# Patient Record
Sex: Female | Born: 1955 | Hispanic: No | Marital: Single
Health system: Southern US, Community
[De-identification: ages and names within clinical notes are randomized; demographics above are authoritative.]

## PROBLEM LIST (undated history)

## (undated) HISTORY — PX: AUGMENTATION MAMMAPLASTY: SUR837

---

## 1998-09-11 ENCOUNTER — Other Ambulatory Visit: Admission: RE | Admit: 1998-09-11 | Discharge: 1998-09-11 | Payer: Self-pay | Admitting: Family Medicine

## 2001-12-29 ENCOUNTER — Other Ambulatory Visit: Admission: RE | Admit: 2001-12-29 | Discharge: 2001-12-29 | Payer: Self-pay | Admitting: Family Medicine

## 2002-01-06 ENCOUNTER — Encounter: Admission: RE | Admit: 2002-01-06 | Discharge: 2002-01-06 | Payer: Self-pay | Admitting: Family Medicine

## 2002-01-06 ENCOUNTER — Encounter: Payer: Self-pay | Admitting: Family Medicine

## 2006-02-18 ENCOUNTER — Other Ambulatory Visit: Admission: RE | Admit: 2006-02-18 | Discharge: 2006-02-18 | Payer: Self-pay | Admitting: Family Medicine

## 2006-06-14 ENCOUNTER — Encounter: Admission: RE | Admit: 2006-06-14 | Discharge: 2006-06-14 | Payer: Self-pay | Admitting: Orthopaedic Surgery

## 2006-12-06 ENCOUNTER — Emergency Department (HOSPITAL_COMMUNITY): Admission: EM | Admit: 2006-12-06 | Discharge: 2006-12-06 | Payer: Self-pay | Admitting: Emergency Medicine

## 2007-03-03 ENCOUNTER — Other Ambulatory Visit: Admission: RE | Admit: 2007-03-03 | Discharge: 2007-03-03 | Payer: Self-pay | Admitting: Family Medicine

## 2010-12-02 ENCOUNTER — Encounter: Payer: Self-pay | Admitting: Orthopaedic Surgery

## 2013-03-23 ENCOUNTER — Ambulatory Visit: Payer: Self-pay | Admitting: Internal Medicine

## 2015-01-13 ENCOUNTER — Other Ambulatory Visit: Payer: Self-pay | Admitting: Obstetrics & Gynecology

## 2015-01-13 ENCOUNTER — Other Ambulatory Visit (HOSPITAL_COMMUNITY)
Admission: RE | Admit: 2015-01-13 | Discharge: 2015-01-13 | Disposition: A | Discharge status: Home / Self Care | Payer: 59 | Source: Ambulatory Visit | Attending: Obstetrics & Gynecology | Admitting: Obstetrics & Gynecology

## 2015-01-13 DIAGNOSIS — Z1151 Encounter for screening for human papillomavirus (HPV): Secondary | ICD-10-CM | POA: Insufficient documentation

## 2015-01-13 DIAGNOSIS — Z01419 Encounter for gynecological examination (general) (routine) without abnormal findings: Secondary | ICD-10-CM | POA: Insufficient documentation

## 2015-01-16 LAB — CYTOLOGY - PAP

## 2015-03-17 ENCOUNTER — Other Ambulatory Visit: Payer: Self-pay | Admitting: Internal Medicine

## 2015-03-17 DIAGNOSIS — Z1231 Encounter for screening mammogram for malignant neoplasm of breast: Secondary | ICD-10-CM

## 2015-03-24 ENCOUNTER — Ambulatory Visit: Payer: 59

## 2015-03-31 ENCOUNTER — Ambulatory Visit
Admission: RE | Admit: 2015-03-31 | Discharge: 2015-03-31 | Disposition: A | Discharge status: Home / Self Care | Payer: 59 | Source: Ambulatory Visit | Attending: Internal Medicine | Admitting: Internal Medicine

## 2015-03-31 DIAGNOSIS — Z1231 Encounter for screening mammogram for malignant neoplasm of breast: Secondary | ICD-10-CM

## 2015-04-13 ENCOUNTER — Other Ambulatory Visit: Payer: Self-pay | Admitting: Gastroenterology

## 2015-07-12 NOTE — Progress Notes (Signed)
07-12-15 1130 Spoke with pt states she will be cancelling procedure for 07-24-15, to call office.

## 2015-07-21 ENCOUNTER — Other Ambulatory Visit: Payer: Self-pay | Admitting: Gastroenterology

## 2015-07-21 NOTE — Progress Notes (Signed)
07-21-15 1655 Left voice message -instructed pt nothing to eat or drink after midnight night before-may take Prilosec with sip water only(if desires). Report to admitting 1000 AM-have insurance card/picture ID, responsible driver, leave metal, jewelry home and valuables. Follow bowel prep instructions per Dr. Henriette Combs office.Call [phone] for any reasons unable to arrive by 1000 AM 07-24-15.

## 2015-07-24 ENCOUNTER — Ambulatory Visit (HOSPITAL_COMMUNITY): Admission: RE | Admit: 2015-07-24 | Payer: 59 | Source: Ambulatory Visit | Admitting: Gastroenterology

## 2015-07-24 ENCOUNTER — Encounter (HOSPITAL_COMMUNITY): Admission: RE | Payer: Self-pay | Source: Ambulatory Visit

## 2015-07-24 ENCOUNTER — Encounter (HOSPITAL_COMMUNITY): Payer: Self-pay | Admitting: Certified Registered Nurse Anesthetist

## 2015-07-24 SURGERY — COLONOSCOPY WITH PROPOFOL
Anesthesia: Monitor Anesthesia Care

## 2015-07-24 MED ORDER — PROPOFOL 10 MG/ML IV BOLUS
INTRAVENOUS | Status: AC
  Filled 2015-07-24: qty 20

## 2015-07-24 NOTE — Anesthesia Preprocedure Evaluation (Deleted)
Anesthesia Evaluation  Patient identified by MRN, date of birth, ID band Patient awake    Reviewed: Allergy & Precautions, H&P , NPO status , Patient's Chart, lab work & pertinent test results  Airway Mallampati: II  TM Distance: >3 FB Neck ROM: full    Dental no notable dental hx. (+) Dental Advisory Given, Teeth Intact   Pulmonary neg pulmonary ROS,    Pulmonary exam normal breath sounds clear to auscultation       Cardiovascular Exercise Tolerance: Good negative cardio ROS Normal cardiovascular exam Rhythm:regular Rate:Normal     Neuro/Psych negative neurological ROS  negative psych ROS   GI/Hepatic negative GI ROS, Neg liver ROS,   Endo/Other  negative endocrine ROS  Renal/GU negative Renal ROS  negative genitourinary   Musculoskeletal   Abdominal   Peds  Hematology negative hematology ROS (+)   Anesthesia Other Findings   Reproductive/Obstetrics negative OB ROS                             Anesthesia Physical Anesthesia Plan  ASA: I  Anesthesia Plan: MAC   Post-op Pain Management:    Induction:   Airway Management Planned:   Additional Equipment:   Intra-op Plan:   Post-operative Plan:   Informed Consent: I have reviewed the patients History and Physical, chart, labs and discussed the procedure including the risks, benefits and alternatives for the proposed anesthesia with the patient or authorized representative who has indicated his/her understanding and acceptance.   Dental Advisory Given  Plan Discussed with: CRNA and Surgeon  Anesthesia Plan Comments:         Anesthesia Quick Evaluation  

## 2015-07-24 NOTE — Progress Notes (Signed)
07-24-15 1610 Spoke with pt after pt left message stating again"I have cancelled this procedure for 07-24-15 and have called Dr. Henriette Combs  Office", this communicated to "Jill,Endo scheduler" and note per Epic and phone call to "Catrina" Dr. Henriette Combs office this AM.

## 2019-12-22 ENCOUNTER — Other Ambulatory Visit: Payer: Self-pay | Admitting: Internal Medicine

## 2019-12-22 DIAGNOSIS — Z1231 Encounter for screening mammogram for malignant neoplasm of breast: Secondary | ICD-10-CM

## 2020-01-28 ENCOUNTER — Ambulatory Visit: Payer: Self-pay

## 2020-04-03 ENCOUNTER — Ambulatory Visit: Payer: Self-pay

## 2020-04-03 ENCOUNTER — Other Ambulatory Visit: Payer: Self-pay | Admitting: Internal Medicine

## 2020-04-03 ENCOUNTER — Other Ambulatory Visit: Payer: Self-pay

## 2020-04-03 ENCOUNTER — Ambulatory Visit
Admission: RE | Admit: 2020-04-03 | Discharge: 2020-04-03 | Disposition: A | Discharge status: Home / Self Care | Payer: BC Managed Care – PPO | Source: Ambulatory Visit | Attending: Internal Medicine | Admitting: Internal Medicine

## 2020-04-03 DIAGNOSIS — Z1231 Encounter for screening mammogram for malignant neoplasm of breast: Secondary | ICD-10-CM

## 2020-07-19 ENCOUNTER — Ambulatory Visit: Payer: BC Managed Care – PPO | Admitting: Dermatology

## 2020-12-26 ENCOUNTER — Telehealth: Payer: Self-pay | Admitting: Hematology and Oncology

## 2020-12-26 NOTE — Telephone Encounter (Signed)
Received a new hem referral from Dr. Donette Larry for mild anemia. Ms. Taylor Dominguez has been cld and scheduled to see Dr. Pamelia Hoit on 3/18 at 830am. Pt awre to arrive 20 minutes early.

## 2021-01-25 NOTE — Progress Notes (Signed)
[facility] CONSULT NOTE  Consulting physician Dr. Eula Listen  CHIEF COMPLAINTS/PURPOSE OF CONSULTATION:  Newly diagnosed anemia  HISTORY OF PRESENTING ILLNESS:  Taylor Dominguez 65 y.o. female is here because of recent diagnosis of anemia. Labs on 12/12/20 showed Hg 11.7, HCT 34.7, platelets 252. She presents to the clinic today for initial evaluation.  She has been suffering from longstanding issues are related to irritable bowel syndrome.  She alternates between constipation and diarrhea.  When she gets diarrhea she feels much weaker and fatigued as well.  Her annual blood work in February showed that she had mild anemia with hemoglobin of 11.7.  This was also noted about a year ago.  Because of concern for anemia she was started on oral iron therapy.  She has appointment to see gastroenterology for upper endoscopy and colonoscopy.  I reviewed her records extensively and collaborated the history with the patient.  MEDICAL HISTORY:  Irritable bowel syndrome SURGICAL HISTORY: Past Surgical History:  Procedure Laterality Date  . AUGMENTATION MAMMAPLASTY Bilateral    replaced 2009     SOCIAL HISTORY: Social History   Socioeconomic History  . Marital status: Single    Spouse name: Not on file  . Number of children: Not on file  . Years of education: Not on file  . Highest education level: Not on file  Occupational History  . Not on file  Tobacco Use  . Smoking status: Not on file  . Smokeless tobacco: Not on file  Substance and Sexual Activity  . Alcohol use: Not on file  . Drug use: Not on file  . Sexual activity: Not on file  Other Topics Concern  . Not on file  Social History Narrative  . Not on file   Social Determinants of Health   Financial Resource Strain: Not on file  Food Insecurity: Not on file  Transportation Needs: Not on file  Physical Activity: Not on file  Stress: Not on file  Social Connections: Not on file  Intimate Partner Violence:  Not on file    FAMILY HISTORY: Family History  Problem Relation Age of Onset  . Breast cancer Neg Hx     ALLERGIES:  has no allergies on file.  MEDICATIONS:  Current Outpatient Medications  Medication Sig Dispense Refill  . omeprazole (PRILOSEC) 20 MG capsule Take by mouth.     No current facility-administered medications for this visit.    REVIEW OF SYSTEMS:   Constitutional: Intermittent fatigue especially when she gets diarrhea. Eyes: Denies blurriness of vision, double vision or watery eyes Ears, nose, mouth, throat, and face: Denies mucositis or sore throat Respiratory: Denies cough, dyspnea or wheezes Cardiovascular: Denies palpitation, chest discomfort or lower extremity swelling Gastrointestinal: Alternating constipation and diarrhea Skin: Denies abnormal skin rashes Lymphatics: Denies new lymphadenopathy or easy bruising Neurological:Denies numbness, tingling or new weaknesses Behavioral/Psych: Mood is stable, no new changes    All other systems were reviewed with the patient and are negative.  PHYSICAL EXAMINATION: ECOG PERFORMANCE STATUS: 1 - Symptomatic but completely ambulatory  There were no vitals filed for this visit. There were no vitals filed for this visit.  GENERAL:alert, no distress and comfortable SKIN: skin color, texture, turgor are normal, no rashes or significant lesions EYES: normal, conjunctiva are pink and non-injected, sclera clear LUNGS: clear to auscultation and percussion with normal breathing effort HEART: regular rate & rhythm and no murmurs and no lower extremity edema ABDOMEN:abdomen soft, non-tender and normal bowel sounds Musculoskeletal:no cyanosis of digits  and no clubbing  PSYCH: alert & oriented x 3 with fluent speech NEURO: no focal motor/sensory deficits  RADIOGRAPHIC STUDIES: I have personally reviewed the radiological reports and agreed with the findings in the report.  ASSESSMENT AND PLAN:  Normocytic anemia Lab  review 12/25/2020: WBC 3.7, hemoglobin 11.7, MCV 94.9, RDW 12.8, platelets 252 (we do not have any prior labs to compare) Mild anemia: Normocytic in nature I discussed with the patient about the different causes of anemia.  Plan: We will check blood work for CBC with differential, iron studies with ferritin, B12 and folic acid. Previous blood work did not show any abnormalities of liver function or kidney function.   Currently patient is taking oral iron therapy.  I asked her to stop it and then we can recheck her labs on Monday. We will check CBC with differential, iron studies, B12 and folic acid. If the iron levels are adequate then I will discontinue oral iron.  Fatigue: Most likely related to irritable bowel syndrome and the diarrhea spells.  Patient is going to see gastroenterology. I will call her with the results of these tests.    All questions were answered. The patient knows to call the clinic with any problems, questions or concerns.   Sabas Sous, MD, MPH 01/26/2021    I, Molly Dorshimer, am acting as scribe for Serena Croissant, MD.  I have reviewed the above documentation for accuracy and completeness, and I agree with the above.

## 2021-01-26 ENCOUNTER — Other Ambulatory Visit: Payer: Self-pay

## 2021-01-26 ENCOUNTER — Inpatient Hospital Stay: Payer: BC Managed Care – PPO | Attending: Hematology and Oncology | Admitting: Hematology and Oncology

## 2021-01-26 DIAGNOSIS — K580 Irritable bowel syndrome with diarrhea: Secondary | ICD-10-CM

## 2021-01-26 DIAGNOSIS — D649 Anemia, unspecified: Secondary | ICD-10-CM | POA: Diagnosis present

## 2021-01-26 DIAGNOSIS — R5383 Other fatigue: Secondary | ICD-10-CM | POA: Diagnosis not present

## 2021-01-26 NOTE — Assessment & Plan Note (Signed)
Lab review 12/25/2020: WBC 3.7, hemoglobin 11.7, MCV 94.9, RDW 12.8, platelets 252 (we do not have any prior labs to compare) Mild anemia: Normocytic in nature I discussed with the patient about the different causes of anemia.  Plan: We will check blood work for CBC with differential, iron studies with ferritin, B12 and folic acid. Previous blood work did not show any abnormalities of liver function or kidney function. There is very little evidence to suggest work-up for myeloma at this time.  I will call her with the results of these tests.

## 2021-07-05 IMAGING — MG DIGITAL SCREENING BREAST BILAT IMPLANT W/ TOMO W/ CAD
9 of 12 series · 9 of 28 positions shown · non-contrast
Comparison: Previous exam(s).

CLINICAL DATA: Screening.

EXAM:
DIGITAL SCREENING BILATERAL MAMMOGRAM WITH IMPLANTS, CAD AND TOMO
The patient has retroglandular implants. Standard and implant
displaced views were performed.

[R MLO]
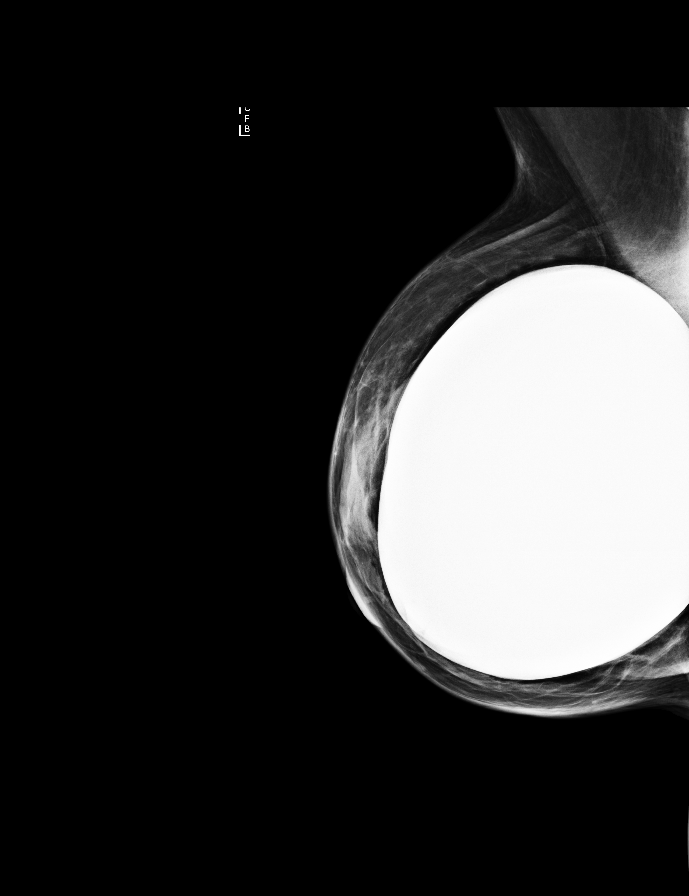

[R CC]
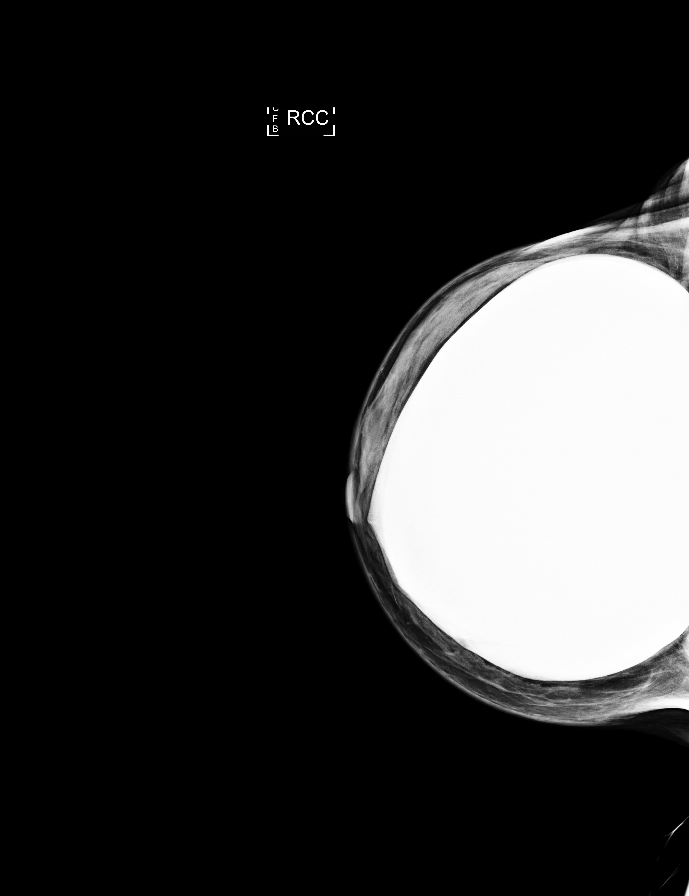

[L MLO]
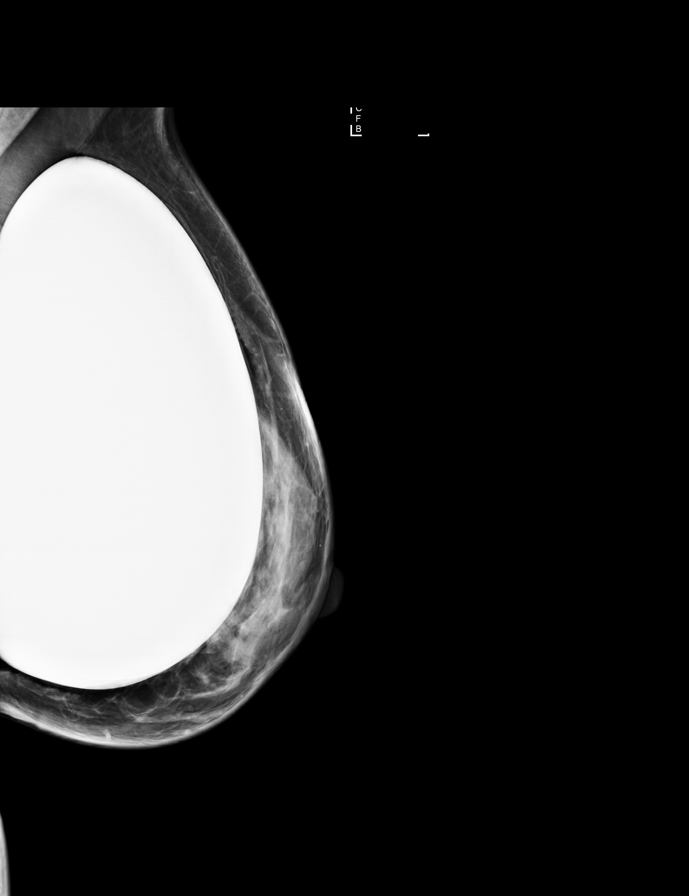

[L CC]
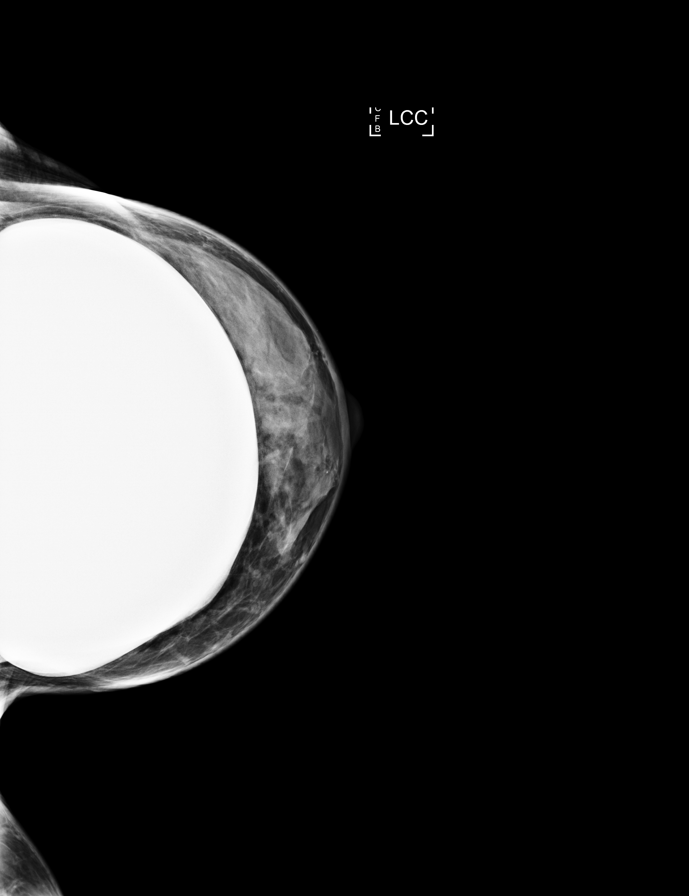

[L MLO synth-2D]
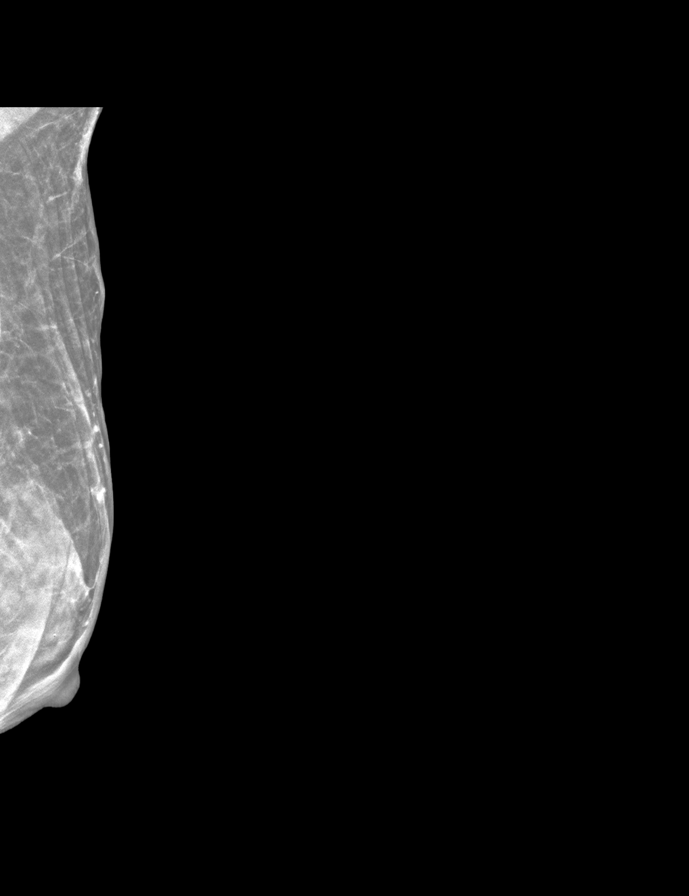

[R MLO synth-2D]
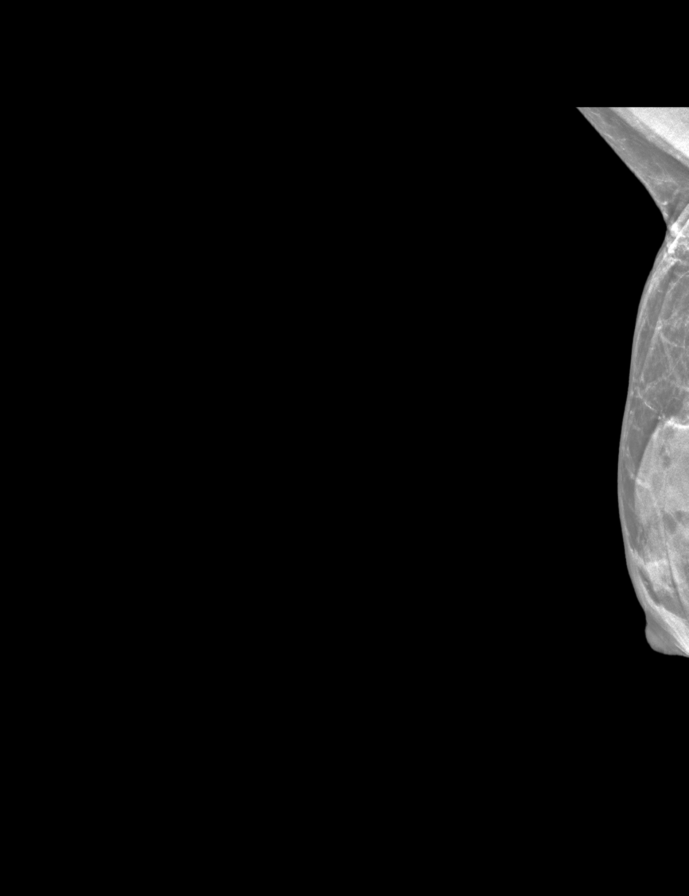

[R CC synth-2D]
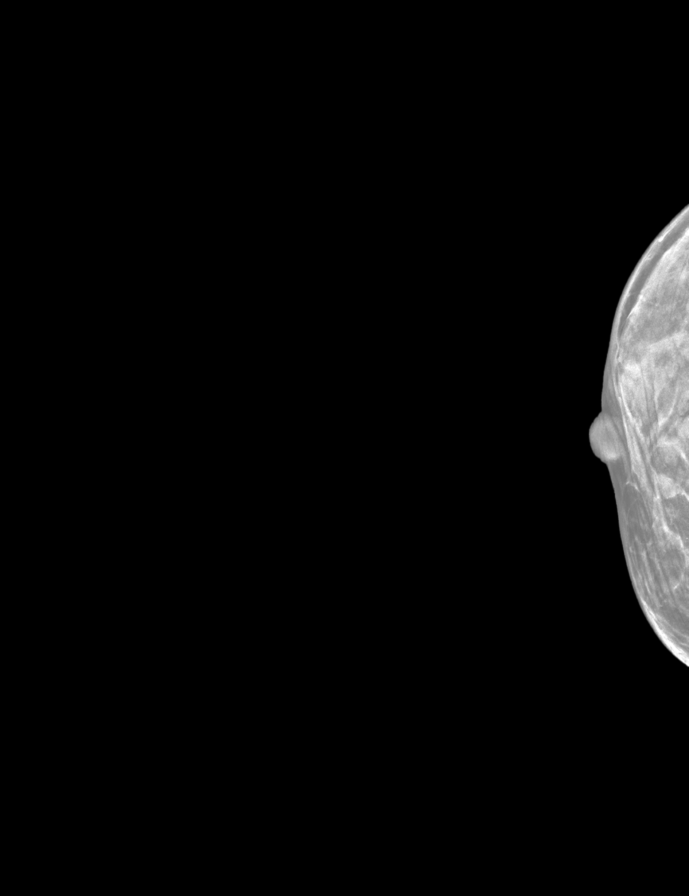

[L CC synth-2D]
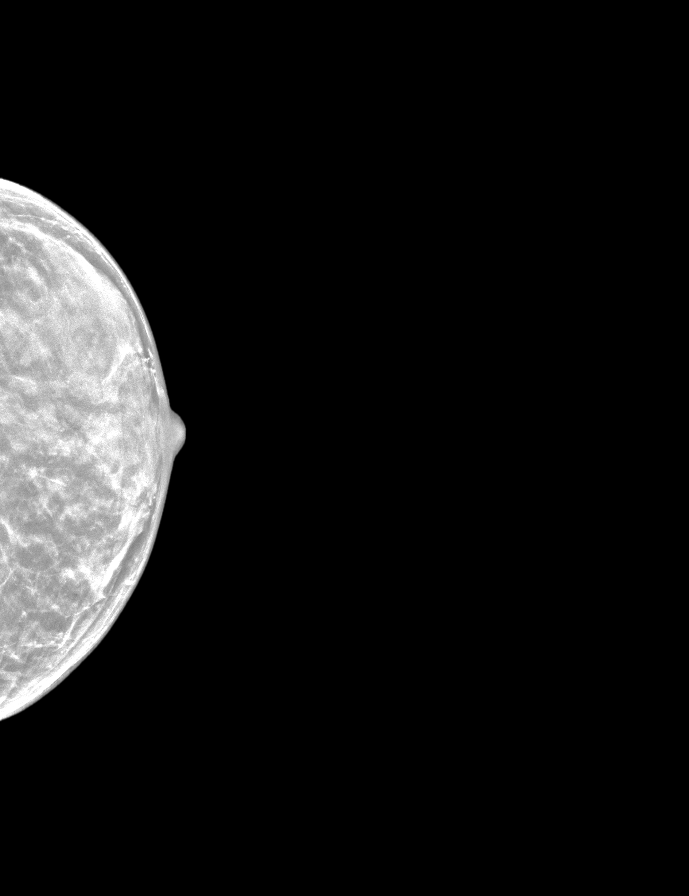

[L CCID BREAST TOMOSYNTHESIS IMAGE tomo · tomo slice 19/36.0]
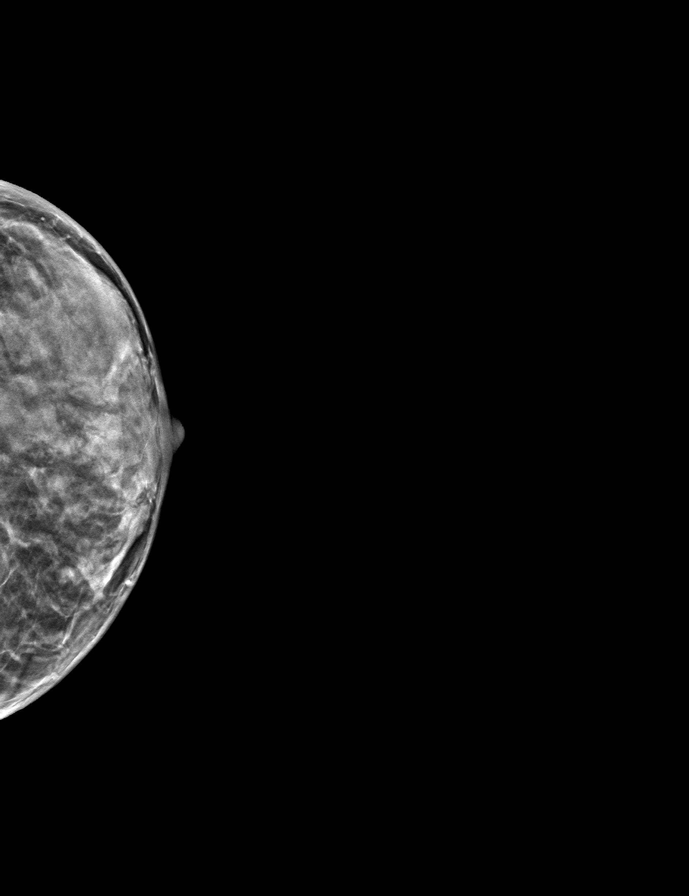

[9 of 28 positions shown; findings below may reference images not displayed]

ACR Breast Density Category d: The breast tissue is extremely dense,
which lowers the sensitivity of mammography.
FINDINGS: There are no findings suspicious for malignancy. Images were
processed with CAD.
IMPRESSION: No mammographic evidence of malignancy. A result letter of this
screening mammogram will be mailed directly to the patient.

RECOMMENDATION:
Screening mammogram in one year. (Code:I3-Q-JNP)

BI-RADS CATEGORY  1:  Negative.

## 2022-04-09 ENCOUNTER — Ambulatory Visit (INDEPENDENT_AMBULATORY_CARE_PROVIDER_SITE_OTHER): Payer: 59

## 2022-04-09 ENCOUNTER — Ambulatory Visit
Admission: EM | Admit: 2022-04-09 | Discharge: 2022-04-09 | Disposition: A | Discharge status: Home / Self Care | Payer: 59 | Attending: Internal Medicine | Admitting: Internal Medicine

## 2022-04-09 ENCOUNTER — Other Ambulatory Visit: Payer: Self-pay

## 2022-04-09 ENCOUNTER — Encounter: Payer: Self-pay | Admitting: Emergency Medicine

## 2022-04-09 DIAGNOSIS — R059 Cough, unspecified: Secondary | ICD-10-CM | POA: Diagnosis not present

## 2022-04-09 DIAGNOSIS — R509 Fever, unspecified: Secondary | ICD-10-CM | POA: Diagnosis not present

## 2022-04-09 DIAGNOSIS — J180 Bronchopneumonia, unspecified organism: Secondary | ICD-10-CM

## 2022-04-09 MED ORDER — AZITHROMYCIN 250 MG PO TABS: ORAL_TABLET | ORAL | 0 refills | Status: AC

## 2022-04-09 MED ORDER — AMOXICILLIN 500 MG PO CAPS: 1000.0000 mg | ORAL_CAPSULE | Freq: Three times a day (TID) | ORAL | 0 refills | Status: AC

## 2022-04-09 MED ORDER — ALBUTEROL SULFATE HFA 108 (90 BASE) MCG/ACT IN AERS: 1.0000 | INHALATION_SPRAY | Freq: Four times a day (QID) | RESPIRATORY_TRACT | 0 refills | Status: DC | PRN

## 2022-04-09 MED ORDER — ACETAMINOPHEN 325 MG PO TABS
650.0000 mg | ORAL_TABLET | Freq: Once | ORAL | Status: AC
  Administered 2022-04-09: 650 mg via ORAL

## 2022-04-09 MED ORDER — PREDNISONE 10 MG (21) PO TBPK: ORAL_TABLET | Freq: Every day | ORAL | 0 refills | Status: DC

## 2022-04-09 NOTE — ED Provider Notes (Signed)
[name]    CSN: [id number] Arrival date & time: 04/09/22  1722      History   Chief Complaint Chief Complaint  Patient presents with   Cough   Fever    HPI Taylor Dominguez is a 66 y.o. female.   Patient presents with cough, fever, nasal congestion.  Patient reports the nasal congestion has been present for approximately 3 weeks.  Cough and fever developed approximately 1.5 weeks ago.  Patient has had low-grade temp at home.  Her daughter has had similar symptoms recently.  Denies chest pain, sore throat, ear pain, nausea, vomiting, diarrhea, abdominal pain.  Patient does report intermittent shortness of breath with exertion.  Denies history of COPD or asthma.  Patient has taken over-the-counter cold and flu medication with minimal improvement in symptoms.   Cough Fever  History reviewed. No pertinent past medical history.  Patient Active Problem List   Diagnosis Date Noted   Normocytic anemia 01/26/2021    Past Surgical History:  Procedure Laterality Date   AUGMENTATION MAMMAPLASTY Bilateral    replaced 2009     OB History   No obstetric history on file.      Home Medications    Prior to Admission medications   Medication Sig Start Date End Date Taking? Authorizing Provider  albuterol (VENTOLIN HFA) 108 (90 Base) MCG/ACT inhaler Inhale 1-2 puffs into the lungs every 6 (six) hours as needed for wheezing or shortness of breath. 04/09/22  Yes Bailee Metter, Rolly SalterHaley E, FNP  amoxicillin (AMOXIL) 500 MG capsule Take 2 capsules (1,000 mg total) by mouth 3 (three) times daily for 5 days. 04/09/22 04/14/22 Yes Mael Delap, Acie FredricksonHaley E, FNP  azithromycin (ZITHROMAX Z-PAK) 250 MG tablet Take 2 tablets (500 mg total) by mouth daily for 1 day, THEN 1 tablet (250 mg total) daily for 4 days. 04/09/22 04/14/22 Yes Pius Byrom, Acie FredricksonHaley E, FNP  predniSONE (STERAPRED UNI-PAK 21 TAB) 10 MG (21) TBPK tablet Take by mouth daily. Take 6 tabs by mouth daily  for 2 days, then 5 tabs for 2 days, then 4  tabs for 2 days, then 3 tabs for 2 days, 2 tabs for 2 days, then 1 tab by mouth daily for 2 days 04/09/22  Yes Yeny Schmoll, BeaconHaley E, FNP  omeprazole (PRILOSEC) 20 MG capsule Take by mouth.    [provider]    Family History Family History  Problem Relation Age of Onset   Breast cancer Neg Hx     Social History     Allergies   Patient has no known allergies.   Review of Systems Review of Systems Per HPI  Physical Exam Triage Vital Signs ED Triage Vitals [04/09/22 1910]  Enc Vitals Group     BP 131/84     Pulse Rate (!) 103     Resp 18     Temp (!) 101.5 F (38.6 C)     Temp Source Oral     SpO2 98 %     Weight      Height      Head Circumference      Peak Flow      Pain Score 5     Pain Loc      Pain Edu?      Excl. in GC?    No data found.  Updated Vital Signs BP 131/84 (BP Location: Left Arm)   Pulse (!) 103   Temp (!) 101.5 F (38.6 C) (Oral)   Resp 18   SpO2  98%   Visual Acuity Right Eye Distance:   Left Eye Distance:   Bilateral Distance:    Right Eye Near:   Left Eye Near:    Bilateral Near:     Physical Exam Constitutional:      General: She is not in acute distress.    Appearance: Normal appearance. She is not toxic-appearing or diaphoretic.  HENT:     Head: Normocephalic and atraumatic.     Right Ear: Tympanic membrane and ear canal normal.     Left Ear: Tympanic membrane and ear canal normal.     Nose: Congestion present.     Mouth/Throat:     Mouth: Mucous membranes are moist.     Pharynx: No posterior oropharyngeal erythema.  Eyes:     Extraocular Movements: Extraocular movements intact.     Conjunctiva/sclera: Conjunctivae normal.     Pupils: Pupils are equal, round, and reactive to light.  Cardiovascular:     Rate and Rhythm: Normal rate and regular rhythm.     Pulses: Normal pulses.     Heart sounds: Normal heart sounds.  Pulmonary:     Effort: Pulmonary effort is normal. No respiratory distress.     Breath sounds:  No stridor. Wheezing and rhonchi present. No rales.     Comments: Mild wheezing noted on exam.  Abdominal:     General: Abdomen is flat. Bowel sounds are normal.     Palpations: Abdomen is soft.  Musculoskeletal:        General: Normal range of motion.     Cervical back: Normal range of motion.  Skin:    General: Skin is warm and dry.  Neurological:     General: No focal deficit present.     Mental Status: She is alert and oriented to person, place, and time. Mental status is at baseline.  Psychiatric:        Mood and Affect: Mood normal.        Behavior: Behavior normal.     UC Treatments / Results  Labs (all labs ordered are listed, but only abnormal results are displayed) Labs Reviewed - No data to display  EKG   Radiology DG Chest 2 View  Result Date: 04/09/2022 CLINICAL DATA:  Cough and fever for 2 weeks EXAM: CHEST - 2 VIEW COMPARISON:  None Available. FINDINGS: Frontal and lateral views of the chest are obtained. Evaluation is limited by suboptimal technique. Cardiac silhouette is unremarkable. The lungs are hyperinflated, with patchy left lower lobe airspace disease identified. No effusion or pneumothorax. No acute bony abnormalities. IMPRESSION: 1. Patchy left lower lobe airspace disease consistent with bronchopneumonia. Electronically Signed   By: Sharlet Salina M.D.   On: 04/09/2022 19:31    Procedures Procedures (including critical care time)  Medications Ordered in UC Medications  acetaminophen (TYLENOL) tablet 650 mg (650 mg Oral Given 04/09/22 1914)    Initial Impression / Assessment and Plan / UC Course  I have reviewed the triage vital signs and the nursing notes.  Pertinent labs & imaging results that were available during my care of the patient were reviewed by me and considered in my medical decision making (see chart for details).     Chest x-ray showing bronchopneumonia.  Will treat with amoxicillin and azithromycin.  Prednisone steroid taper also  prescribed to decrease inflammation and help with wheezing.  Patient also prescribed albuterol inhaler.  Patient encouraged to follow-up in 4 to 6 weeks to have repeat chest x-ray to ensure adequate resolution  of pneumonia.  No respiratory distress so do think outpatient treatment is okay.  She was given strict return and ER precautions.  Fever monitoring and management discussed with patient.  Tylenol administered in urgent care.  Patient verbalized understanding and was agreeable with plan. Final Clinical Impressions(s) / UC Diagnoses   Final diagnoses:  Bronchopneumonia     Discharge Instructions      You have pneumonia which is being treated with 4 different medications.  Please follow-up in 4 to 6 weeks to have repeat chest x-ray completed to ensure that it has resolved.  Follow-up sooner if symptoms persist or worsen.    ED Prescriptions     Medication Sig Dispense Auth. Provider   amoxicillin (AMOXIL) 500 MG capsule Take 2 capsules (1,000 mg total) by mouth 3 (three) times daily for 5 days. 30 capsule [place], [place], [place]   azithromycin (ZITHROMAX Z-PAK) 250 MG tablet Take 2 tablets (500 mg total) by mouth daily for 1 day, THEN 1 tablet (250 mg total) daily for 4 days. 6 tablet [place], [place] E, [place]   predniSONE (STERAPRED UNI-PAK 21 TAB) 10 MG (21) TBPK tablet Take by mouth daily. Take 6 tabs by mouth daily  for 2 days, then 5 tabs for 2 days, then 4 tabs for 2 days, then 3 tabs for 2 days, 2 tabs for 2 days, then 1 tab by mouth daily for 2 days 42 tablet Tamre Cass, Rolly Salter E, FNP   albuterol (VENTOLIN HFA) 108 (90 Base) MCG/ACT inhaler Inhale 1-2 puffs into the lungs every 6 (six) hours as needed for wheezing or shortness of breath. 1 each Gustavus Bryant, [place]      PDMP not reviewed this encounter.   Gustavus Bryant, [place] 04/09/22 2015

## 2022-04-09 NOTE — ED Triage Notes (Signed)
Pt here for cough and fever x 2 weeks

## 2022-04-09 NOTE — Discharge Instructions (Signed)
You have pneumonia which is being treated with 4 different medications.  Please follow-up in 4 to 6 weeks to have repeat chest x-ray completed to ensure that it has resolved.  Follow-up sooner if symptoms persist or worsen.

## 2022-08-12 DIAGNOSIS — G548 Other nerve root and plexus disorders: Secondary | ICD-10-CM | POA: Diagnosis not present

## 2022-08-12 DIAGNOSIS — L603 Nail dystrophy: Secondary | ICD-10-CM | POA: Diagnosis not present

## 2022-08-12 DIAGNOSIS — D485 Neoplasm of uncertain behavior of skin: Secondary | ICD-10-CM | POA: Diagnosis not present

## 2022-08-12 DIAGNOSIS — L578 Other skin changes due to chronic exposure to nonionizing radiation: Secondary | ICD-10-CM | POA: Diagnosis not present

## 2022-08-12 DIAGNOSIS — D045 Carcinoma in situ of skin of trunk: Secondary | ICD-10-CM | POA: Diagnosis not present

## 2022-08-12 DIAGNOSIS — L989 Disorder of the skin and subcutaneous tissue, unspecified: Secondary | ICD-10-CM | POA: Diagnosis not present

## 2022-08-26 DIAGNOSIS — D0372 Melanoma in situ of left lower limb, including hip: Secondary | ICD-10-CM | POA: Diagnosis not present

## 2022-08-26 DIAGNOSIS — D487 Neoplasm of uncertain behavior of other specified sites: Secondary | ICD-10-CM | POA: Diagnosis not present

## 2022-09-02 ENCOUNTER — Other Ambulatory Visit: Payer: Self-pay | Admitting: Internal Medicine

## 2022-09-02 ENCOUNTER — Ambulatory Visit
Admission: RE | Admit: 2022-09-02 | Discharge: 2022-09-02 | Disposition: A | Discharge status: Home / Self Care | Payer: Medicare Other | Source: Ambulatory Visit | Attending: Internal Medicine | Admitting: Internal Medicine

## 2022-09-02 DIAGNOSIS — E2839 Other primary ovarian failure: Secondary | ICD-10-CM

## 2022-09-02 DIAGNOSIS — R0789 Other chest pain: Secondary | ICD-10-CM | POA: Diagnosis not present

## 2022-09-02 DIAGNOSIS — K589 Irritable bowel syndrome without diarrhea: Secondary | ICD-10-CM | POA: Diagnosis not present

## 2022-09-02 DIAGNOSIS — Z1231 Encounter for screening mammogram for malignant neoplasm of breast: Secondary | ICD-10-CM

## 2022-09-02 DIAGNOSIS — D649 Anemia, unspecified: Secondary | ICD-10-CM | POA: Diagnosis not present

## 2022-09-02 DIAGNOSIS — K219 Gastro-esophageal reflux disease without esophagitis: Secondary | ICD-10-CM | POA: Diagnosis not present

## 2022-09-02 DIAGNOSIS — Z0000 Encounter for general adult medical examination without abnormal findings: Secondary | ICD-10-CM | POA: Diagnosis not present

## 2022-09-02 DIAGNOSIS — Z136 Encounter for screening for cardiovascular disorders: Secondary | ICD-10-CM | POA: Diagnosis not present

## 2022-09-09 DIAGNOSIS — C44529 Squamous cell carcinoma of skin of other part of trunk: Secondary | ICD-10-CM | POA: Diagnosis not present

## 2022-09-09 DIAGNOSIS — D045 Carcinoma in situ of skin of trunk: Secondary | ICD-10-CM | POA: Diagnosis not present

## 2022-09-18 DIAGNOSIS — H353131 Nonexudative age-related macular degeneration, bilateral, early dry stage: Secondary | ICD-10-CM | POA: Diagnosis not present

## 2022-09-18 DIAGNOSIS — H52203 Unspecified astigmatism, bilateral: Secondary | ICD-10-CM | POA: Diagnosis not present

## 2022-09-18 DIAGNOSIS — H5213 Myopia, bilateral: Secondary | ICD-10-CM | POA: Diagnosis not present

## 2022-10-06 ENCOUNTER — Ambulatory Visit
Admission: EM | Admit: 2022-10-06 | Discharge: 2022-10-06 | Disposition: A | Discharge status: Home / Self Care | Payer: Medicare Other | Attending: Physician Assistant | Admitting: Physician Assistant

## 2022-10-06 ENCOUNTER — Other Ambulatory Visit: Payer: Self-pay

## 2022-10-06 ENCOUNTER — Encounter: Payer: Self-pay | Admitting: Emergency Medicine

## 2022-10-06 DIAGNOSIS — N751 Abscess of Bartholin's gland: Secondary | ICD-10-CM

## 2022-10-06 MED ORDER — DOXYCYCLINE HYCLATE 100 MG PO CAPS: 100.0000 mg | ORAL_CAPSULE | Freq: Two times a day (BID) | ORAL | 0 refills | Status: DC

## 2022-10-06 NOTE — ED Triage Notes (Signed)
Pt here for left sided vaginal abscess with pain and swelling x 5 days

## 2022-10-06 NOTE — ED Provider Notes (Signed)
[name]    CSN: [id number] Arrival date & time: 10/06/22  0940      History   Chief Complaint Chief Complaint  Patient presents with   Abscess    HPI Taylor Dominguez is a 66 y.o. female.   Patient here today for evaluate of vulvar abscess that started 5 days ago.  She reports that prior to that she had a small knot in the area but has never had swelling to this effect.  She states that the area is very tender.  She has tried warm compresses without resolution.  She does not report any drainage.  She denies any fever.  She has not any nausea or vomiting.  The history is provided by the patient.    History reviewed. No pertinent past medical history.  Patient Active Problem List   Diagnosis Date Noted   Normocytic anemia 01/26/2021    Past Surgical History:  Procedure Laterality Date   AUGMENTATION MAMMAPLASTY Bilateral    replaced 2009     OB History   No obstetric history on file.      Home Medications    Prior to Admission medications   Medication Sig Start Date End Date Taking? Authorizing Provider  doxycycline (VIBRAMYCIN) 100 MG capsule Take 1 capsule (100 mg total) by mouth 2 (two) times daily. 10/06/22  Yes Tomi Bamberger, PA-C  albuterol (VENTOLIN HFA) 108 (90 Base) MCG/ACT inhaler Inhale 1-2 puffs into the lungs every 6 (six) hours as needed for wheezing or shortness of breath. 04/09/22   Gustavus Bryant, FNP  omeprazole (PRILOSEC) 20 MG capsule Take by mouth.    [provider]  predniSONE (STERAPRED UNI-PAK 21 TAB) 10 MG (21) TBPK tablet Take by mouth daily. Take 6 tabs by mouth daily  for 2 days, then 5 tabs for 2 days, then 4 tabs for 2 days, then 3 tabs for 2 days, 2 tabs for 2 days, then 1 tab by mouth daily for 2 days Patient not taking: Reported on 10/06/2022 04/09/22   Gustavus Bryant, FNP    Family History Family History  Problem Relation Age of Onset   Breast cancer Neg Hx     Social History      Allergies   Patient has no known allergies.   Review of Systems Review of Systems  Constitutional:  Negative for chills and fever.  Eyes:  Negative for discharge and redness.  Respiratory:  Negative for shortness of breath.   Gastrointestinal:  Negative for nausea and vomiting.  Skin:  Positive for color change. Negative for wound.     Physical Exam Triage Vital Signs ED Triage Vitals [10/06/22 1210]  Enc Vitals Group     BP 132/78     Pulse Rate 65     Resp 13     Temp (!) 97.4 F (36.3 C)     Temp Source Oral     SpO2 98 %     Weight      Height      Head Circumference      Peak Flow      Pain Score      Pain Loc      Pain Edu?      Excl. in GC?    No data found.  Updated Vital Signs BP 132/78 (BP Location: Left Arm)   Pulse 65   Temp (!) 97.4 F (36.3 C) (Oral)   Resp 13   SpO2 98%  Physical Exam Vitals and nursing note reviewed.  Constitutional:      General: She is not in acute distress.    Appearance: Normal appearance. She is not ill-appearing.  HENT:     Head: Normocephalic and atraumatic.  Eyes:     Conjunctiva/sclera: Conjunctivae normal.  Cardiovascular:     Rate and Rhythm: Normal rate.  Pulmonary:     Effort: Pulmonary effort is normal. No respiratory distress.  Genitourinary:    Comments: Diffuse swelling and erythema to left vulva, no active drainage Neurological:     Mental Status: She is alert.  Psychiatric:        Mood and Affect: Mood normal.        Behavior: Behavior normal.        Thought Content: Thought content normal.      UC Treatments / Results  Labs (all labs ordered are listed, but only abnormal results are displayed) Labs Reviewed - No data to display  EKG   Radiology No results found.  Procedures Procedures (including critical care time)  Medications Ordered in UC Medications - No data to display  Initial Impression / Assessment and Plan / UC Course  I have reviewed the triage vital signs  and the nursing notes.  Pertinent labs & imaging results that were available during my care of the patient were reviewed by me and considered in my medical decision making (see chart for details).   See cyclin prescribed for suspected abscess of Bartholin cyst.  Recommended follow-up with PCP in 2 days if no improvement or sooner with any further concerns.  Encouraged warm compresses and sitz baths to hopefully promote drainage as well.  Recommend further evaluation sooner with any worsening symptoms.   Final Clinical Impressions(s) / UC Diagnoses   Final diagnoses:  Abscess of left Bartholin's gland   Discharge Instructions   None    ED Prescriptions     Medication Sig Dispense Auth. Provider   doxycycline (VIBRAMYCIN) 100 MG capsule Take 1 capsule (100 mg total) by mouth 2 (two) times daily. 20 capsule Tomi Bamberger, PA-C      PDMP not reviewed this encounter.   Tomi Bamberger, PA-C 10/06/22 1328

## 2022-10-07 ENCOUNTER — Ambulatory Visit: Payer: Medicare Other

## 2022-10-07 DIAGNOSIS — N751 Abscess of Bartholin's gland: Secondary | ICD-10-CM | POA: Diagnosis not present

## 2022-10-09 DIAGNOSIS — N764 Abscess of vulva: Secondary | ICD-10-CM | POA: Diagnosis not present

## 2022-10-16 DIAGNOSIS — N764 Abscess of vulva: Secondary | ICD-10-CM | POA: Diagnosis not present

## 2022-11-05 DIAGNOSIS — R197 Diarrhea, unspecified: Secondary | ICD-10-CM | POA: Diagnosis not present

## 2022-11-05 DIAGNOSIS — R109 Unspecified abdominal pain: Secondary | ICD-10-CM | POA: Diagnosis not present

## 2022-11-06 ENCOUNTER — Other Ambulatory Visit: Payer: Self-pay | Admitting: Internal Medicine

## 2022-11-06 ENCOUNTER — Ambulatory Visit
Admission: RE | Admit: 2022-11-06 | Discharge: 2022-11-06 | Disposition: A | Discharge status: Home / Self Care | Payer: Medicare Other | Source: Ambulatory Visit | Attending: Internal Medicine | Admitting: Internal Medicine

## 2022-11-06 DIAGNOSIS — R197 Diarrhea, unspecified: Secondary | ICD-10-CM

## 2022-11-06 DIAGNOSIS — R109 Unspecified abdominal pain: Secondary | ICD-10-CM

## 2022-11-06 DIAGNOSIS — K6389 Other specified diseases of intestine: Secondary | ICD-10-CM | POA: Diagnosis not present

## 2022-11-06 DIAGNOSIS — K589 Irritable bowel syndrome without diarrhea: Secondary | ICD-10-CM | POA: Diagnosis not present

## 2022-11-06 DIAGNOSIS — K7689 Other specified diseases of liver: Secondary | ICD-10-CM | POA: Diagnosis not present

## 2022-11-06 DIAGNOSIS — R188 Other ascites: Secondary | ICD-10-CM | POA: Diagnosis not present

## 2022-11-06 MED ORDER — IOPAMIDOL (ISOVUE-300) INJECTION 61%
70.0000 mL | Freq: Once | INTRAVENOUS | Status: AC | PRN
  Administered 2022-11-06: 70 mL via INTRAVENOUS

## 2022-11-18 DIAGNOSIS — A0472 Enterocolitis due to Clostridium difficile, not specified as recurrent: Secondary | ICD-10-CM | POA: Diagnosis not present

## 2022-12-30 DIAGNOSIS — Z85820 Personal history of malignant melanoma of skin: Secondary | ICD-10-CM | POA: Diagnosis not present

## 2022-12-30 DIAGNOSIS — K219 Gastro-esophageal reflux disease without esophagitis: Secondary | ICD-10-CM | POA: Diagnosis not present

## 2022-12-30 DIAGNOSIS — D509 Iron deficiency anemia, unspecified: Secondary | ICD-10-CM | POA: Diagnosis not present

## 2023-01-10 DIAGNOSIS — Z1151 Encounter for screening for human papillomavirus (HPV): Secondary | ICD-10-CM | POA: Diagnosis not present

## 2023-01-10 DIAGNOSIS — Z124 Encounter for screening for malignant neoplasm of cervix: Secondary | ICD-10-CM | POA: Diagnosis not present

## 2023-01-10 DIAGNOSIS — Z01419 Encounter for gynecological examination (general) (routine) without abnormal findings: Secondary | ICD-10-CM | POA: Diagnosis not present

## 2023-02-14 ENCOUNTER — Other Ambulatory Visit: Payer: Self-pay

## 2023-02-14 ENCOUNTER — Ambulatory Visit: Payer: Self-pay

## 2023-02-17 ENCOUNTER — Ambulatory Visit
Admission: RE | Admit: 2023-02-17 | Discharge: 2023-02-17 | Disposition: A | Discharge status: Home / Self Care | Payer: Self-pay | Source: Ambulatory Visit | Attending: Internal Medicine | Admitting: Internal Medicine

## 2023-02-17 ENCOUNTER — Ambulatory Visit
Admission: RE | Admit: 2023-02-17 | Discharge: 2023-02-17 | Disposition: A | Discharge status: Home / Self Care | Payer: Medicare Other | Source: Ambulatory Visit | Attending: Internal Medicine | Admitting: Internal Medicine

## 2023-02-17 DIAGNOSIS — E2839 Other primary ovarian failure: Secondary | ICD-10-CM

## 2023-02-17 DIAGNOSIS — Z1231 Encounter for screening mammogram for malignant neoplasm of breast: Secondary | ICD-10-CM

## 2023-02-19 ENCOUNTER — Other Ambulatory Visit: Payer: Self-pay | Admitting: Internal Medicine

## 2023-02-19 DIAGNOSIS — R928 Other abnormal and inconclusive findings on diagnostic imaging of breast: Secondary | ICD-10-CM

## 2023-02-26 DIAGNOSIS — M810 Age-related osteoporosis without current pathological fracture: Secondary | ICD-10-CM | POA: Diagnosis not present

## 2023-03-03 ENCOUNTER — Ambulatory Visit
Admission: RE | Admit: 2023-03-03 | Discharge: 2023-03-03 | Disposition: A | Discharge status: Home / Self Care | Payer: Self-pay | Source: Ambulatory Visit | Attending: Internal Medicine | Admitting: Internal Medicine

## 2023-03-03 ENCOUNTER — Ambulatory Visit
Admission: RE | Admit: 2023-03-03 | Discharge: 2023-03-03 | Disposition: A | Discharge status: Home / Self Care | Payer: Medicare Other | Source: Ambulatory Visit | Attending: Internal Medicine | Admitting: Internal Medicine

## 2023-03-03 DIAGNOSIS — N6489 Other specified disorders of breast: Secondary | ICD-10-CM | POA: Diagnosis not present

## 2023-03-03 DIAGNOSIS — R928 Other abnormal and inconclusive findings on diagnostic imaging of breast: Secondary | ICD-10-CM

## 2023-03-10 DIAGNOSIS — K080 Exfoliation of teeth due to systemic causes: Secondary | ICD-10-CM | POA: Diagnosis not present

## 2023-03-19 DIAGNOSIS — L814 Other melanin hyperpigmentation: Secondary | ICD-10-CM | POA: Diagnosis not present

## 2023-03-19 DIAGNOSIS — L821 Other seborrheic keratosis: Secondary | ICD-10-CM | POA: Diagnosis not present

## 2023-03-19 DIAGNOSIS — L820 Inflamed seborrheic keratosis: Secondary | ICD-10-CM | POA: Diagnosis not present

## 2023-03-19 DIAGNOSIS — L570 Actinic keratosis: Secondary | ICD-10-CM | POA: Diagnosis not present

## 2023-03-19 DIAGNOSIS — D485 Neoplasm of uncertain behavior of skin: Secondary | ICD-10-CM | POA: Diagnosis not present

## 2023-03-19 DIAGNOSIS — D225 Melanocytic nevi of trunk: Secondary | ICD-10-CM | POA: Diagnosis not present

## 2023-04-21 DIAGNOSIS — K080 Exfoliation of teeth due to systemic causes: Secondary | ICD-10-CM | POA: Diagnosis not present

## 2023-05-12 DIAGNOSIS — J069 Acute upper respiratory infection, unspecified: Secondary | ICD-10-CM | POA: Diagnosis not present

## 2023-05-26 DIAGNOSIS — K080 Exfoliation of teeth due to systemic causes: Secondary | ICD-10-CM | POA: Diagnosis not present

## 2023-11-07 DIAGNOSIS — H2513 Age-related nuclear cataract, bilateral: Secondary | ICD-10-CM | POA: Diagnosis not present

## 2023-11-07 DIAGNOSIS — H25013 Cortical age-related cataract, bilateral: Secondary | ICD-10-CM | POA: Diagnosis not present

## 2023-11-07 DIAGNOSIS — H353131 Nonexudative age-related macular degeneration, bilateral, early dry stage: Secondary | ICD-10-CM | POA: Diagnosis not present

## 2023-11-10 DIAGNOSIS — Z20822 Contact with and (suspected) exposure to covid-19: Secondary | ICD-10-CM | POA: Diagnosis not present

## 2023-11-10 DIAGNOSIS — J205 Acute bronchitis due to respiratory syncytial virus: Secondary | ICD-10-CM | POA: Diagnosis not present

## 2023-11-15 DIAGNOSIS — B338 Other specified viral diseases: Secondary | ICD-10-CM | POA: Diagnosis not present

## 2023-11-20 DIAGNOSIS — R051 Acute cough: Secondary | ICD-10-CM | POA: Diagnosis not present

## 2023-11-20 DIAGNOSIS — R509 Fever, unspecified: Secondary | ICD-10-CM | POA: Diagnosis not present

## 2023-11-20 DIAGNOSIS — R0989 Other specified symptoms and signs involving the circulatory and respiratory systems: Secondary | ICD-10-CM | POA: Diagnosis not present

## 2023-11-21 DIAGNOSIS — R509 Fever, unspecified: Secondary | ICD-10-CM | POA: Diagnosis not present

## 2023-11-21 DIAGNOSIS — R0989 Other specified symptoms and signs involving the circulatory and respiratory systems: Secondary | ICD-10-CM | POA: Diagnosis not present

## 2023-11-21 DIAGNOSIS — R051 Acute cough: Secondary | ICD-10-CM | POA: Diagnosis not present

## 2023-11-24 DIAGNOSIS — L270 Generalized skin eruption due to drugs and medicaments taken internally: Secondary | ICD-10-CM | POA: Diagnosis not present

## 2023-11-26 DIAGNOSIS — D225 Melanocytic nevi of trunk: Secondary | ICD-10-CM | POA: Diagnosis not present

## 2023-11-26 DIAGNOSIS — L570 Actinic keratosis: Secondary | ICD-10-CM | POA: Diagnosis not present

## 2023-11-26 DIAGNOSIS — L814 Other melanin hyperpigmentation: Secondary | ICD-10-CM | POA: Diagnosis not present

## 2023-11-26 DIAGNOSIS — L820 Inflamed seborrheic keratosis: Secondary | ICD-10-CM | POA: Diagnosis not present

## 2023-11-26 DIAGNOSIS — L538 Other specified erythematous conditions: Secondary | ICD-10-CM | POA: Diagnosis not present

## 2023-11-26 DIAGNOSIS — L821 Other seborrheic keratosis: Secondary | ICD-10-CM | POA: Diagnosis not present

## 2023-12-03 DIAGNOSIS — D649 Anemia, unspecified: Secondary | ICD-10-CM | POA: Diagnosis not present

## 2023-12-03 DIAGNOSIS — R7309 Other abnormal glucose: Secondary | ICD-10-CM | POA: Diagnosis not present

## 2023-12-03 DIAGNOSIS — D0372 Melanoma in situ of left lower limb, including hip: Secondary | ICD-10-CM | POA: Diagnosis not present

## 2023-12-03 DIAGNOSIS — K589 Irritable bowel syndrome without diarrhea: Secondary | ICD-10-CM | POA: Diagnosis not present

## 2023-12-03 DIAGNOSIS — Z0000 Encounter for general adult medical examination without abnormal findings: Secondary | ICD-10-CM | POA: Diagnosis not present

## 2023-12-03 DIAGNOSIS — K219 Gastro-esophageal reflux disease without esophagitis: Secondary | ICD-10-CM | POA: Diagnosis not present

## 2023-12-15 DIAGNOSIS — J189 Pneumonia, unspecified organism: Secondary | ICD-10-CM | POA: Diagnosis not present

## 2024-01-05 DIAGNOSIS — D473 Essential (hemorrhagic) thrombocythemia: Secondary | ICD-10-CM | POA: Diagnosis not present

## 2024-01-12 DIAGNOSIS — Z01419 Encounter for gynecological examination (general) (routine) without abnormal findings: Secondary | ICD-10-CM | POA: Diagnosis not present

## 2024-01-22 ENCOUNTER — Other Ambulatory Visit: Payer: Self-pay | Admitting: Internal Medicine

## 2024-01-22 DIAGNOSIS — R110 Nausea: Secondary | ICD-10-CM

## 2024-01-26 ENCOUNTER — Ambulatory Visit
Admission: RE | Admit: 2024-01-26 | Discharge: 2024-01-26 | Disposition: A | Discharge status: Home / Self Care | Source: Ambulatory Visit | Attending: Internal Medicine | Admitting: Internal Medicine

## 2024-01-26 DIAGNOSIS — R110 Nausea: Secondary | ICD-10-CM | POA: Diagnosis not present

## 2024-01-26 DIAGNOSIS — R109 Unspecified abdominal pain: Secondary | ICD-10-CM | POA: Diagnosis not present

## 2024-02-02 DIAGNOSIS — D72819 Decreased white blood cell count, unspecified: Secondary | ICD-10-CM | POA: Diagnosis not present

## 2024-02-03 DIAGNOSIS — H2511 Age-related nuclear cataract, right eye: Secondary | ICD-10-CM | POA: Diagnosis not present

## 2024-02-03 DIAGNOSIS — H25011 Cortical age-related cataract, right eye: Secondary | ICD-10-CM | POA: Diagnosis not present

## 2024-02-03 DIAGNOSIS — H25811 Combined forms of age-related cataract, right eye: Secondary | ICD-10-CM | POA: Diagnosis not present

## 2024-05-06 DIAGNOSIS — H43811 Vitreous degeneration, right eye: Secondary | ICD-10-CM | POA: Diagnosis not present

## 2024-05-25 DIAGNOSIS — L570 Actinic keratosis: Secondary | ICD-10-CM | POA: Diagnosis not present

## 2024-05-25 DIAGNOSIS — L578 Other skin changes due to chronic exposure to nonionizing radiation: Secondary | ICD-10-CM | POA: Diagnosis not present

## 2024-05-25 DIAGNOSIS — L821 Other seborrheic keratosis: Secondary | ICD-10-CM | POA: Diagnosis not present

## 2024-05-25 DIAGNOSIS — L814 Other melanin hyperpigmentation: Secondary | ICD-10-CM | POA: Diagnosis not present

## 2024-05-25 DIAGNOSIS — D225 Melanocytic nevi of trunk: Secondary | ICD-10-CM | POA: Diagnosis not present

## 2024-05-31 DIAGNOSIS — K589 Irritable bowel syndrome without diarrhea: Secondary | ICD-10-CM | POA: Diagnosis not present

## 2024-05-31 DIAGNOSIS — K219 Gastro-esophageal reflux disease without esophagitis: Secondary | ICD-10-CM | POA: Diagnosis not present

## 2024-05-31 DIAGNOSIS — Z85820 Personal history of malignant melanoma of skin: Secondary | ICD-10-CM | POA: Diagnosis not present

## 2024-05-31 DIAGNOSIS — D72819 Decreased white blood cell count, unspecified: Secondary | ICD-10-CM | POA: Diagnosis not present

## 2024-07-05 ENCOUNTER — Inpatient Hospital Stay: Attending: Hematology and Oncology | Admitting: Hematology and Oncology

## 2024-07-05 ENCOUNTER — Inpatient Hospital Stay

## 2024-07-05 DIAGNOSIS — H43811 Vitreous degeneration, right eye: Secondary | ICD-10-CM | POA: Diagnosis not present

## 2024-07-16 DIAGNOSIS — S99929A Unspecified injury of unspecified foot, initial encounter: Secondary | ICD-10-CM | POA: Diagnosis not present

## 2024-07-16 DIAGNOSIS — L0390 Cellulitis, unspecified: Secondary | ICD-10-CM | POA: Diagnosis not present

## 2024-08-09 ENCOUNTER — Inpatient Hospital Stay: Attending: Hematology and Oncology | Admitting: Hematology and Oncology

## 2024-08-09 ENCOUNTER — Inpatient Hospital Stay

## 2024-08-09 VITALS — BP 134/72 | HR 67 | Temp 97.4°F | Resp 17 | Ht 63.78 in | Wt 102.7 lb

## 2024-08-09 DIAGNOSIS — B009 Herpesviral infection, unspecified: Secondary | ICD-10-CM | POA: Diagnosis not present

## 2024-08-09 DIAGNOSIS — D72819 Decreased white blood cell count, unspecified: Secondary | ICD-10-CM | POA: Insufficient documentation

## 2024-08-09 DIAGNOSIS — Z79624 Long term (current) use of inhibitors of nucleotide synthesis: Secondary | ICD-10-CM | POA: Diagnosis not present

## 2024-08-09 DIAGNOSIS — Z8701 Personal history of pneumonia (recurrent): Secondary | ICD-10-CM | POA: Diagnosis not present

## 2024-08-09 DIAGNOSIS — D709 Neutropenia, unspecified: Secondary | ICD-10-CM

## 2024-08-09 LAB — CBC WITH DIFFERENTIAL (CANCER CENTER ONLY)
Abs Immature Granulocytes: 0.01 K/uL (ref 0.00–0.07)
Basophils Absolute: 0.1 K/uL (ref 0.0–0.1)
Basophils Relative: 2 %
Eosinophils Absolute: 0.2 K/uL (ref 0.0–0.5)
Eosinophils Relative: 6 %
HCT: 40 % (ref 36.0–46.0)
Hemoglobin: 13.6 g/dL (ref 12.0–15.0)
Immature Granulocytes: 0 %
Lymphocytes Relative: 22 %
Lymphs Abs: 0.7 K/uL (ref 0.7–4.0)
MCH: 31.9 pg (ref 26.0–34.0)
MCHC: 34 g/dL (ref 30.0–36.0)
MCV: 93.7 fL (ref 80.0–100.0)
Monocytes Absolute: 0.4 K/uL (ref 0.1–1.0)
Monocytes Relative: 12 %
Neutro Abs: 1.8 K/uL (ref 1.7–7.7)
Neutrophils Relative %: 58 %
Platelet Count: 217 K/uL (ref 150–400)
RBC: 4.27 MIL/uL (ref 3.87–5.11)
RDW: 12.6 % (ref 11.5–15.5)
WBC Count: 3.1 K/uL — ABNORMAL LOW (ref 4.0–10.5)
nRBC: 0 % (ref 0.0–0.2)

## 2024-08-09 LAB — VITAMIN B12: Vitamin B-12: 530 pg/mL (ref 180–914)

## 2024-08-09 LAB — FOLATE: Folate: 10.8 ng/mL (ref >5.9)

## 2024-08-09 NOTE — Progress Notes (Signed)
 [facility] CONSULT NOTE  Patient Care Team: Pa, [facility] as PCP - General  CHIEF COMPLAINTS/PURPOSE OF CONSULTATION:  Consultation for neutropenia and frequent infections  HISTORY OF PRESENTING ILLNESS:   History of Present Illness Taylor Dominguez is a 68 year old female with a history of frequent infections and leukopenia who presents for evaluation of these issues. She was referred by her primary doctor for evaluation of her white blood cell count.  She has experienced frequent infections over the past few years, including post-surgical infections after procedures for melanoma in situ and squamous cell carcinoma, requiring antibiotics. In December 2023, she had a Clostridioides difficile infection, preceded by pneumonia earlier that spring. Recurrent pneumonia and respiratory syncytial virus infection occurred in early 2024.  Her white blood cell count has fluctuated significantly, with levels ranging from 18 to 2.1 in January and February 2025. No recent blood work has been done since February 2025.  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 She does not currently take prednisone , doxycycline , or use an inhaler, but she does take a stomach acid pill.    I reviewed her records extensively and collaborated the history with the patient.   MEDICAL HISTORY:  No past medical history on file.  SURGICAL HISTORY: Past Surgical History:  Procedure Laterality Date   AUGMENTATION MAMMAPLASTY Bilateral    replaced 2009     SOCIAL HISTORY: Social History   Socioeconomic History   Marital status: Single    Spouse name: Not on file   Number of children: Not on file   Years of education: Not on file   Highest education level: Not on file  Occupational History   Not on file  Tobacco Use   Smoking status: Not on file   Smokeless tobacco: Not on file  Substance and Sexual Activity   Alcohol use: Not on file    Drug use: Not on file   Sexual activity: Not on file  Other Topics Concern   Not on file  Social History Narrative   Not on file   Social Drivers of Health   Financial Resource Strain: Not on file  Food Insecurity: Not on file  Transportation Needs: Not on file  Physical Activity: Not on file  Stress: Not on file  Social Connections: Not on file  Intimate Partner Violence: Not on file    FAMILY HISTORY: Family History  Problem Relation Age of Onset   Breast cancer Neg Hx     ALLERGIES:  has no known allergies.  MEDICATIONS:  Current Outpatient Medications  Medication Sig Dispense Refill   omeprazole (PRILOSEC) 20 MG capsule Take by mouth.     No current facility-administered medications for this visit.    REVIEW OF SYSTEMS:   Constitutional: Denies fevers, chills or abnormal night sweats All other systems were reviewed with the patient and are negative.  PHYSICAL EXAMINATION: ECOG PERFORMANCE STATUS: 1 - Symptomatic but completely ambulatory  Vitals:   08/09/24 0837  BP: 134/72  Pulse: 67  Resp: 17  Temp: (!) 97.4 F (36.3 C)  SpO2: 100%   Filed Weights   08/09/24 0837  Weight: 102 lb 11.2 oz (46.6 kg)    GENERAL:alert, no distress and comfortable    RADIOGRAPHIC STUDIES: I have personally reviewed the radiological reports and agreed with the findings in the report.  ASSESSMENT AND PLAN:  Leukopenia Lab review: 11/18/2022: WBC 5.4, hemoglobin 11.7, platelets 418, ANC 4 11/20/2023: WBC 18.8,  hemoglobin 10.3, platelets 414, ANC 16.9 12/03/2023: WBC 3.3, hemoglobin 12, platelets 615, iron saturation 16% 01/05/2024: WBC 2.1, hemoglobin 12.5, platelets 262, ANC 1.5, ALC 1.1  Differential diagnosis: 1. Infections: Especially viruses 2. inflammation/autoimmune causes including lupus/rheumatoid arthritis 3. Nutritional causes but B-12 or folic acid deficiency 4. Medication induced: Patient takes Valtrex periodically. 5. Bone marrow disorders  Workup  today: 1. CBC with differential with smear 2. B-12 and folic acid levels 3. ANA 4.  Check quantitative immunoglobulins because of her frequent infections  Her husband passed away last month from glioblastoma and she is grieving from his loss. Telephone visit in 1 week to discuss the results and consider bone marrow biopsy if necessary     All questions were answered. The patient knows to call the clinic with any problems, questions or concerns.    Viinay K Demarion Pondexter, MD 08/09/24

## 2024-08-09 NOTE — Assessment & Plan Note (Signed)
 Lab review: 11/18/2022: WBC 5.4, hemoglobin 11.7, platelets 418, ANC 4 11/20/2023: WBC 18.8, hemoglobin 10.3, platelets 414, ANC 16.9 12/03/2023: WBC 3.3, hemoglobin 12, platelets 615, iron saturation 16% 01/05/2024: WBC 2.1, hemoglobin 12.5, platelets 262, ANC 1.5, ALC 1.1  Differential diagnosis: 1. Infections: Especially viruses 2. inflammation/autoimmune causes including lupus/rheumatoid arthritis 3. Nutritional causes but B-12 or folic acid deficiency 4. Medication induced 5. Bone marrow disorders  Workup today: 1. CBC with differential with smear 2. B-12 and folic acid levels 3. ANA  Telephone visit in 2 weeks to discuss the results and consider bone marrow biopsy if necessary

## 2024-08-10 LAB — IGG, IGA, IGM
IgA: 173 mg/dL (ref 87–352)
IgG (Immunoglobin G), Serum: 1076 mg/dL (ref 586–1602)
IgM (Immunoglobulin M), Srm: 75 mg/dL (ref 26–217)

## 2024-08-10 LAB — FANA STAINING PATTERNS: Speckled Pattern: 24529

## 2024-08-10 LAB — ANTINUCLEAR ANTIBODIES, IFA: ANA Ab, IFA: POSITIVE — AB

## 2024-08-16 ENCOUNTER — Inpatient Hospital Stay: Attending: Hematology and Oncology | Admitting: Hematology and Oncology

## 2024-08-16 VITALS — BP 119/67 | HR 77 | Temp 97.6°F | Resp 16 | Wt 101.0 lb

## 2024-08-16 DIAGNOSIS — M19041 Primary osteoarthritis, right hand: Secondary | ICD-10-CM | POA: Insufficient documentation

## 2024-08-16 DIAGNOSIS — D649 Anemia, unspecified: Secondary | ICD-10-CM | POA: Diagnosis not present

## 2024-08-16 DIAGNOSIS — R7689 Other specified abnormal immunological findings in serum: Secondary | ICD-10-CM | POA: Insufficient documentation

## 2024-08-16 DIAGNOSIS — D709 Neutropenia, unspecified: Secondary | ICD-10-CM

## 2024-08-16 DIAGNOSIS — D72819 Decreased white blood cell count, unspecified: Secondary | ICD-10-CM | POA: Diagnosis not present

## 2024-08-16 DIAGNOSIS — M19042 Primary osteoarthritis, left hand: Secondary | ICD-10-CM | POA: Diagnosis not present

## 2024-08-16 NOTE — Progress Notes (Signed)
   Patient Care Team: Pa, [facility] as PCP - General  DIAGNOSIS:  Encounter Diagnosis  Name Primary?   Normocytic anemia Yes      CHIEF COMPLIANT: Follow-up to discuss the results of blood work for leukopenia  HISTORY OF PRESENT ILLNESS:   History of Present Illness Taylor Dominguez is a 68 year old female who presents for follow-up on blood test results. She is accompanied by her daughter, Taylor Dominguez, who is a Teacher, early years/pre.  She has a history of low white blood cell count, previously at 2.1 in February, now improved to 3.1. The differential count is within normal range. Red blood cell count, hemoglobin at 13.6, and platelet counts are normal. Immunoglobulin levels are normal.  A recent ANA test is positive with a speckled pattern. She experiences chronic arthritic symptoms in her hands and spine. There is no family history of autoimmune conditions. Gastrointestinal symptoms have been attributed to IBS.     ALLERGIES:  has no known allergies.  MEDICATIONS:  Current Outpatient Medications  Medication Sig Dispense Refill   calcium carbonate (TUMS EX) 750 MG chewable tablet Chew 1 tablet by mouth daily.     omeprazole (PRILOSEC) 20 MG capsule Take by mouth. (Patient not taking: Reported on 08/16/2024)     No current facility-administered medications for this visit.    PHYSICAL EXAMINATION: ECOG PERFORMANCE STATUS: 1 - Symptomatic but completely ambulatory  Vitals:   08/16/24 1405  BP: 119/67  Pulse: 77  Resp: 16  Temp: 97.6 F (36.4 C)  SpO2: 100%   Filed Weights   08/16/24 1405  Weight: 101 lb (45.8 kg)    Physical Exam   (exam performed in the presence of a chaperone)  LABORATORY DATA:  I have reviewed the data as listed     No data to display          Lab Results  Component Value Date   WBC 3.1 (L) 08/09/2024   HGB 13.6 08/09/2024   HCT 40.0 08/09/2024   MCV 93.7 08/09/2024   PLT 217 08/09/2024   NEUTROABS 1.8 08/09/2024     ASSESSMENT & PLAN:  Leukopenia Lab review: 11/18/2022: WBC 5.4, hemoglobin 11.7, platelets 418, ANC 4 11/20/2023: WBC 18.8, hemoglobin 10.3, platelets 414, ANC 16.9 12/03/2023: WBC 3.3, hemoglobin 12, platelets 615, iron saturation 16% 01/05/2024: WBC 2.1, hemoglobin 12.5, platelets 262, ANC 1.5, ALC 1.1 08/09/2024: WBC 3.1, hemoglobin 13.6, ANC 1.8, ALC 0.7, ANA positive: 1: 80, B12: 530, folate 10.8  Plan: Refer the patient to rheumatology for discussion regarding the positive ANA and the leukopenia. Follow-up in 1 year to discuss the trends in her blood counts over the past year. ------------------------------------- Assessment and Plan Assessment & Plan Leukopenia Leukopenia with WBC count improved to 3.1. Differential normal, indicating adequate immune function. No increased infection risk. - Re-evaluate in one year unless significant decline in blood counts occurs.  Positive antinuclear antibody (ANA) Positive ANA with speckled pattern suggests possible autoimmune condition. Arthritis in hands and spine noted. - Refer to rheumatology for further evaluation.      No orders of the defined types were placed in this encounter.  The patient has a good understanding of the overall plan. she agrees with it. she will call with any problems that may develop before the next visit here. Total time spent: 30 mins including face to face time and time spent for planning, charting and co-ordination of care   Taylor MARLA Chad, MD 08/16/24

## 2024-08-16 NOTE — Assessment & Plan Note (Signed)
 Lab review: 11/18/2022: WBC 5.4, hemoglobin 11.7, platelets 418, ANC 4 11/20/2023: WBC 18.8, hemoglobin 10.3, platelets 414, ANC 16.9 12/03/2023: WBC 3.3, hemoglobin 12, platelets 615, iron saturation 16% 01/05/2024: WBC 2.1, hemoglobin 12.5, platelets 262, ANC 1.5, ALC 1.1 08/09/2024: WBC 3.1, ANC 1.8, ALC 0.7, ANA positive: 1: 80, B12: 530, folate 10.8  Plan: Refer the patient to rheumatology for discussion regarding the positive ANA and the leukopenia.

## 2024-08-30 ENCOUNTER — Encounter: Payer: Self-pay | Admitting: Hematology and Oncology

## 2024-09-01 ENCOUNTER — Other Ambulatory Visit: Payer: Self-pay | Admitting: *Deleted

## 2024-09-01 DIAGNOSIS — R7689 Other specified abnormal immunological findings in serum: Secondary | ICD-10-CM

## 2024-09-16 DIAGNOSIS — M79642 Pain in left hand: Secondary | ICD-10-CM | POA: Diagnosis not present

## 2024-09-16 DIAGNOSIS — M549 Dorsalgia, unspecified: Secondary | ICD-10-CM | POA: Diagnosis not present

## 2024-09-16 DIAGNOSIS — H04129 Dry eye syndrome of unspecified lacrimal gland: Secondary | ICD-10-CM | POA: Diagnosis not present

## 2024-09-16 DIAGNOSIS — M79641 Pain in right hand: Secondary | ICD-10-CM | POA: Diagnosis not present

## 2024-09-16 DIAGNOSIS — R760 Raised antibody titer: Secondary | ICD-10-CM | POA: Diagnosis not present

## 2024-09-16 DIAGNOSIS — M79643 Pain in unspecified hand: Secondary | ICD-10-CM | POA: Diagnosis not present

## 2024-09-16 DIAGNOSIS — M2550 Pain in unspecified joint: Secondary | ICD-10-CM | POA: Diagnosis not present

## 2024-10-18 DIAGNOSIS — K080 Exfoliation of teeth due to systemic causes: Secondary | ICD-10-CM | POA: Diagnosis not present

## 2024-11-19 ENCOUNTER — Other Ambulatory Visit: Payer: Self-pay | Admitting: Internal Medicine

## 2024-11-19 DIAGNOSIS — Z1231 Encounter for screening mammogram for malignant neoplasm of breast: Secondary | ICD-10-CM

## 2024-12-03 ENCOUNTER — Ambulatory Visit: Attending: Nurse Practitioner | Admitting: Nurse Practitioner

## 2024-12-03 VITALS — BP 120/80 | HR 68 | Ht 63.0 in | Wt 104.0 lb

## 2024-12-03 DIAGNOSIS — R0602 Shortness of breath: Secondary | ICD-10-CM

## 2024-12-03 DIAGNOSIS — R002 Palpitations: Secondary | ICD-10-CM

## 2024-12-03 DIAGNOSIS — R0600 Dyspnea, unspecified: Secondary | ICD-10-CM | POA: Diagnosis not present

## 2024-12-03 DIAGNOSIS — H9313 Tinnitus, bilateral: Secondary | ICD-10-CM | POA: Diagnosis not present

## 2024-12-03 DIAGNOSIS — R0789 Other chest pain: Secondary | ICD-10-CM | POA: Diagnosis not present

## 2024-12-03 DIAGNOSIS — R42 Dizziness and giddiness: Secondary | ICD-10-CM

## 2024-12-03 DIAGNOSIS — R5383 Other fatigue: Secondary | ICD-10-CM | POA: Diagnosis not present

## 2024-12-03 NOTE — Progress Notes (Signed)
 "  Office Visit    Patient Name: Taylor Dominguez Date of Encounter: 12/03/2024  Primary Care Provider:  [facility] Primary Cardiologist:  None  Chief Complaint    69 year old female with a history of leukopenia, IBS, osteoporosis, osteoarthritis, GERD, and prior tobacco use, who presents for her first clinic new patient evaluation.  Past Medical History    No past medical history on file. Past Surgical History:  Procedure Laterality Date   AUGMENTATION MAMMAPLASTY Bilateral    replaced 2009     Allergies  Allergies[1]   Labs/Other Studies Reviewed    The following studies were reviewed today:     Recent Labs: 08/09/2024: Hemoglobin 13.6; Platelet Count 217  Recent Lipid Panel No results found for: CHOL, TRIG, HDL, CHOLHDL, VLDL, LDLCALC, LDLDIRECT  History of Present Illness    69 year old female with the above past medical history including leukopenia, IBS, osteoporosis, osteoarthritis, GERD, and prior tobacco use.  She saw her PCP in January 2026 and reported a greater than six month history of generalized fatigue, increased shortness of breath with activity, intermittent chest tightness, and intermittent lightheadedness with exercise, particularly when lifting weights.  She was referred to cardiology.    She presents today for heart first clinic new patient evaluation.  In addition to her fatigue/decreased activity tolerance, shortness of breath, chest tightness, and lightheadedness, she has also noticed a pounding/ringing in her ears, as well as an elevated heart rate with activity, HR reaching up to 170 bpm.  She denies any presyncope or syncope.  She also reports intermittent palpitations, which she describes as a flip-flop, that occur multiple times a week, lasting for seconds at a time. She is an active person, she exercises 5 days a week and has done so for a long time (combination of cardio and weights).  She shares  that she has had a very difficult year.  Her husband was diagnosed with glioblastoma in 08/28/2024he died in 07/08/2024.  She lost her mother the same year.  She now lives alone.  She has 2 children, a son and a daughter, one 13-year-old grandson, whom she cares for.  She is a retired runner, broadcasting/film/video.  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  Her PCP referred her to rheumatology in the setting of positive ANA, per patient, workup was overall unremarkable, follow-up was recommended as needed.  She has a history of prior tobacco use.  She smoked half pack per day for approximately 10 years, she quit in 2016.  She drinks a half pot of coffee per day, denies any alcohol use.   Home Medications    Current Outpatient Medications  Medication Sig Dispense Refill   acetaminophen  (TYLENOL ) 325 MG tablet Take 650 mg by mouth every 6 (six) hours as needed for mild pain (pain score 1-3) or moderate pain (pain score 4-6).     calcium carbonate (TUMS EX) 750 MG chewable tablet Chew 1 tablet by mouth daily.     ibuprofen (ADVIL) 200 MG tablet Take 200 mg by mouth every 4 (four) hours as needed for moderate pain (pain score 4-6) or mild pain (pain score 1-3).     pantoprazole (PROTONIX) 40 MG tablet Take 40 mg by mouth daily.     No current facility-administered medications for this visit.     Review of Systems    She denies pnd, orthopnea, n, v, syncope, edema, weight gain, or early satiety. All other  systems reviewed and are otherwise negative except as noted above.   Physical Exam    VS:  BP 120/80   Pulse 68   Ht 5' 3 (1.6 m)   Wt 104 lb (47.2 kg)   BMI 18.42 kg/m   GEN: Well nourished, well developed, in no acute distress. HEENT: normal. Neck: Supple, no JVD, carotid bruits, or masses. Cardiac: RRR, no murmurs, rubs, or gallops. No clubbing, cyanosis, edema.  Radials/DP/PT 2+ and equal bilaterally.  Respiratory:  Respirations regular and  unlabored, clear to auscultation bilaterally. GI: Soft, nontender, nondistended, BS + x 4. MS: no deformity or atrophy. Skin: warm and dry, no rash. Neuro:  Strength and sensation are intact. Psych: Normal affect.  Accessory Clinical Findings    ECG personally reviewed by me today - EKG Interpretation Date/Time:  Friday December 03 2024 08:34:10 EST Ventricular Rate:  64 PR Interval:  136 QRS Duration:  70 QT Interval:  390 QTC Calculation: 402 R Axis:   46  Text Interpretation: Normal sinus rhythm Normal ECG When compared with ECG of 23-Mar-2013 15:33, No significant change was found Confirmed by Daneen Perkins ([id number]) on 12/03/2024 8:36:24 AM  - no acute changes.   Lab Results  Component Value Date   WBC 3.1 (L) 08/09/2024   HGB 13.6 08/09/2024   HCT 40.0 08/09/2024   MCV 93.7 08/09/2024   PLT 217 08/09/2024   No results found for: CREATININE, BUN, NA, K, CL, CO2 No results found for: ALT, AST, GGT, ALKPHOS, BILITOT No results found for: CHOL, HDL, LDLCALC, LDLDIRECT, TRIG, CHOLHDL  No results found for: HGBA1C  Assessment & Plan    1. Generalized fatigue/shortness of breath with exertion/chest tightness: She reports a greater than 80-month history of generalized fatigue/decreased activity tolerance, increased shortness of breath with activity, intermittent chest tightness, intermittent lightheadedness with exercise.  She is very active, has exercised 5 days a week for a long time. We discussed possible cardiac evaluation with echocardiogram, coronary CT angiogram, ZIO monitor. She declines any testing today, she would like more time to think about her options. She lives in [place], [place] .  Will have her follow-up with an APP in our [place] office in 6 weeks to reevaluate symptoms, re-visit testing options.  Reviewed ED precautions.  2. Palpitations/lightheadedness/tinnitus: She reports intermittent palpitations, which she describes as  a flip-flop, that occur multiple times a week, lasting for seconds at a time.  He also reports intermittent lightheadedness with exercise, particularly when lifting weights, as well as a pounding/ringing in her ears, elevated heart rate with activity (HR as high as 170 bpm).  Denies any presyncope or syncope.  We discussed possible ZIO monitor, echocardiogram and coronary CT angiogram as above, she declines any testing today.  Of note, she does drink a half a pot of coffee a day (she just recently cut back to one half bottle per day).  We discussed caffeine, dehydration as a possible trigger for symptoms.  Encouraged decrease caffeine intake, adequate hydration. Reviewed ED precautions.   3. Disposition: Follow-up in 6 weeks with [facility] APP.      Perkins JAYSON Daneen, NP 12/03/2024, 9:28 AM       [1] No Known Allergies  "

## 2024-12-03 NOTE — Patient Instructions (Addendum)
 Medication Instructions:  Your physician recommends that you continue on your current medications as directed. Please refer to the Current Medication list given to you today.  *If you need a refill on your cardiac medications before your next appointment, please call your pharmacy*  Lab Work: None ordered today. If you have labs (blood work) drawn today and your tests are completely normal, you will receive your results only by: MyChart Message (if you have MyChart) OR A paper copy in the mail If you have any lab test that is abnormal or we need to change your treatment, we will call you to review the results.  Testing/Procedures: None ordered today.  Follow-Up: At [facility], you and your health needs are our priority.  As part of our continuing mission to provide you with exceptional heart care, our providers are all part of one team.  This team includes your primary Cardiologist (physician) and Advanced Practice Providers or APPs (Physician Assistants and Nurse Practitioners) who all work together to provide you with the care you need, when you need it.  Your next appointment:   6 week(s)  Provider:   You will see one of the following Advanced Practice Providers on your designated Care Team:   Lonni Meager, NP Lesley Maffucci, PA-C Bernardino Bring, PA-C Cadence [place], PA-C Tylene Lunch, NP Barnie Hila, NP   We recommend signing up for the patient portal called MyChart.  Sign up information is provided on this After Visit Summary.  MyChart is used to connect with patients for Virtual Visits (Telemedicine).  Patients are able to view lab/test results, encounter notes, upcoming appointments, etc.  Non-urgent messages can be sent to your provider as well.   To learn more about what you can do with MyChart, go to [email or web address].   Other Instructions     ZIO XT- Long Term Monitor Instructions  Your physician has requested you wear a ZIO patch monitor for  14 days.  This is a single patch monitor. [employer] supplies one patch monitor per enrollment. Additional stickers are not available. Please do not apply patch if you will be having a Nuclear Stress Test,  Echocardiogram, Cardiac CT, MRI, or Chest Xray during the period you would be wearing the  monitor. The patch cannot be worn during these tests. You cannot remove and re-apply the  ZIO XT patch monitor.  Your ZIO patch monitor will be mailed 3 day USPS to your address on file. It may take 3-5 days  to receive your monitor after you have been enrolled.  Once you have received your monitor, please review the enclosed instructions. Your monitor  has already been registered assigning a specific monitor serial # to you.  Billing and Patient Assistance Program Information  We have supplied [employer] with any of your insurance information on file for billing purposes. [employer] offers a sliding scale Patient Assistance Program for patients that do not have  insurance, or whose insurance does not completely cover the cost of the ZIO monitor.  You must apply for the Patient Assistance Program to qualify for this discounted rate.  To apply, please call [employer] at [phone], select option 4, select option 2, ask to apply for  Patient Assistance Program. Meredeth will ask your household income, and how many people  are in your household. They will quote your out-of-pocket cost based on that information.  [employer] will also be able to set up a 31-month, interest-free payment plan if needed.  Applying the monitor   Shave hair  from upper left chest.  Hold abrader disc by orange tab. Rub abrader in 40 strokes over the upper left chest as  indicated in your monitor instructions.  Clean area with 4 enclosed alcohol pads. Let dry.  Apply patch as indicated in monitor instructions. Patch will be placed under collarbone on left  side of chest with arrow pointing upward.  Rub patch adhesive wings for 2 minutes.  Remove white label marked 1. Remove the white  label marked 2. Rub patch adhesive wings for 2 additional minutes.  While looking in a mirror, press and release button in center of patch. A small green light will  flash 3-4 times. This will be your only indicator that the monitor has been turned on.  Do not shower for the first 24 hours. You may shower after the first 24 hours.  Press the button if you feel a symptom. You will hear a small click. Record Date, Time and  Symptom in the Patient Logbook.  When you are ready to remove the patch, follow instructions on the last 2 pages of Patient  Logbook. Stick patch monitor onto the last page of Patient Logbook.  Place Patient Logbook in the blue and white box. Use locking tab on box and tape box closed  securely. The blue and white box has prepaid postage on it. Please place it in the mailbox as  soon as possible. Your physician should have your test results approximately 7 days after the  monitor has been mailed back to [facility].  Call [facility] Customer Care at [phone] if you have questions regarding  your ZIO XT patch monitor. Call them immediately if you see an orange light blinking on your  monitor.  If your monitor falls off in less than 4 days, contact our Monitor department at [phone].  If your monitor becomes loose or falls off after 4 days call [employer] at [phone] for  suggestions on securing your monitor

## 2024-12-06 ENCOUNTER — Encounter: Payer: Self-pay | Admitting: Nurse Practitioner

## 2024-12-13 ENCOUNTER — Ambulatory Visit

## 2024-12-27 ENCOUNTER — Ambulatory Visit

## 2025-01-10 ENCOUNTER — Ambulatory Visit: Payer: Self-pay | Admitting: Rheumatology

## 2025-01-24 ENCOUNTER — Ambulatory Visit: Admitting: Medical

## 2025-02-08 ENCOUNTER — Ambulatory Visit: Payer: Self-pay | Admitting: Rheumatology

## 2025-08-16 ENCOUNTER — Ambulatory Visit: Admitting: Hematology and Oncology

## 2025-08-16 ENCOUNTER — Other Ambulatory Visit
